# Patient Record
Sex: Female | Born: 1995
Health system: Southern US, Community
[De-identification: ages and names within clinical notes are randomized; demographics above are authoritative.]

## PROBLEM LIST (undated history)

## (undated) ENCOUNTER — Inpatient Hospital Stay (HOSPITAL_COMMUNITY): Payer: Self-pay

## (undated) DIAGNOSIS — R51 Headache: Secondary | ICD-10-CM

## (undated) DIAGNOSIS — J45909 Unspecified asthma, uncomplicated: Secondary | ICD-10-CM

## (undated) DIAGNOSIS — T7840XA Allergy, unspecified, initial encounter: Secondary | ICD-10-CM

## (undated) DIAGNOSIS — R519 Headache, unspecified: Secondary | ICD-10-CM

## (undated) DIAGNOSIS — F419 Anxiety disorder, unspecified: Secondary | ICD-10-CM

## (undated) HISTORY — PX: WISDOM TOOTH EXTRACTION: SHX21

## (undated) HISTORY — PX: NO PAST SURGERIES: SHX2092

---
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
## 2014-03-11 ENCOUNTER — Encounter (HOSPITAL_COMMUNITY): Payer: Self-pay | Admitting: *Deleted

## 2015-05-11 LAB — OB RESULTS CONSOLE GC/CHLAMYDIA: Chlamydia: NEGATIVE

## 2015-05-11 NOTE — L&D Delivery Note (Signed)
Patient was C/C/+2 and pushed for approx 10 minutes with epidural.   NSVD  female infant, Apgars 8/9, weight pending.   The patient had no laceration. Fundus was firm. EBL was expected amount. Placenta was delivered intact. Vagina was clear.  Baby was vigorous and doing skin to skin with mother.  Kristin Walton, Kristin Walton

## 2015-05-21 LAB — OB RESULTS CONSOLE ABO/RH: RH TYPE: POSITIVE

## 2015-05-21 LAB — OB RESULTS CONSOLE GC/CHLAMYDIA: Gonorrhea: NEGATIVE

## 2015-05-21 LAB — OB RESULTS CONSOLE ANTIBODY SCREEN: Antibody Screen: NEGATIVE

## 2015-05-21 LAB — OB RESULTS CONSOLE HEPATITIS B SURFACE ANTIGEN: HEP B S AG: NEGATIVE

## 2015-05-21 LAB — OB RESULTS CONSOLE RUBELLA ANTIBODY, IGM: Rubella: IMMUNE

## 2015-05-21 LAB — OB RESULTS CONSOLE RPR: RPR: NONREACTIVE

## 2015-05-21 LAB — OB RESULTS CONSOLE HIV ANTIBODY (ROUTINE TESTING): HIV: NONREACTIVE

## 2015-10-23 LAB — OB RESULTS CONSOLE GBS: STREP GROUP B AG: NEGATIVE

## 2015-11-05 ENCOUNTER — Ambulatory Visit (INDEPENDENT_AMBULATORY_CARE_PROVIDER_SITE_OTHER): Payer: 59 | Admitting: Neurology

## 2015-11-05 ENCOUNTER — Encounter: Payer: Self-pay | Admitting: Neurology

## 2015-11-05 VITALS — HR 98 | Ht 66.0 in | Wt 152.0 lb

## 2015-11-05 DIAGNOSIS — Z3493 Encounter for supervision of normal pregnancy, unspecified, third trimester: Secondary | ICD-10-CM

## 2015-11-05 DIAGNOSIS — H539 Unspecified visual disturbance: Secondary | ICD-10-CM | POA: Diagnosis not present

## 2015-11-05 NOTE — Progress Notes (Signed)
NEUROLOGY CONSULTATION NOTE  Antoine Pocheshley C Gist MRN: [id number] DOB: 1995/11/29  Referring provider: Dr. Claiborne Billingsallahan Primary care provider: No PCP  Reason for consult:  Visual disturbance  HISTORY OF PRESENT ILLNESS: Kristin Walton is a 20 year old right-handed female at 3538 weeks 2 days gestation and history of migraines who presents for visual disturbance.  History obtained by patient, her husband and OB/GYN note.  She has history of migraines with visual aura.  Headaches are bi-temporal and associated with nausea.  They are preceded by a central scintillating scotoma, which usually lasts 30 minutes.  They usually occur once a month.  Since she has been pregnant, she has had a gradual increase in frequency, to about once a week.  On Monday, she developed the central scintillating scotoma, however it has still not resolved.  She did initially have a slight headache with mild nausea, but that didn't last more than a day.  She currently denies headache, diplopia, vertigo or focal weakness.  She occasionally has numbness in the leg that is noticeable when standing or walking but not sitting.  She has low back pain but no radicular pain.  She denies weakness.  She is able to shake the feeling out.  Her baby is leaning on the right side.    She was seen by ophthalmology, Dr. Delaney MeigsStonecipher, today.  Her exam was unremarkable.  She did not demonstrate visual field loss, papilledema/optic neuropathy, or abnormality of her cornea or lens.  08/20/15 CBC showed WBC 5.2, HGB 11.9, HCT 35.5 and PLT 149.  PAST MEDICAL HISTORY: Past Medical History  Diagnosis Date  . Asthma   . Allergy     PAST SURGICAL HISTORY: Past Surgical History  Procedure Laterality Date  . No past surgeries    . Wisdom tooth extraction      MEDICATIONS: Current Outpatient Prescriptions on File Prior to Visit  Medication Sig Dispense Refill  . Prenatal Vit-Fe Fumarate-FA (PRENATAL MULTIVITAMIN) TABS Take 1 tablet by mouth every  morning.     No current facility-administered medications on file prior to visit.    ALLERGIES: Allergies  Allergen Reactions  . Amoxicillin Hives  . Penicillins Hives  . Zithromax [Azithromycin]     FAMILY HISTORY: Family History  Problem Relation Age of Onset  . Hypertension Father   Brother has migraines  SOCIAL HISTORY: Social History   Social History  . Marital Status: Married    Spouse Name: N/A  . Number of Children: N/A  . Years of Education: N/A   Occupational History  . Not on file.   Social History Main Topics  . Smoking status: Never Smoker   . Smokeless tobacco: Not on file  . Alcohol Use: No  . Drug Use: No  . Sexual Activity: Yes   Other Topics Concern  . Not on file   Social History Narrative    REVIEW OF SYSTEMS: Constitutional: No fevers, chills, or sweats, no generalized fatigue, change in appetite Eyes: No visual changes, double vision, eye pain Ear, nose and throat: No hearing loss, ear pain, nasal congestion, sore throat Cardiovascular: No chest pain, palpitations Respiratory:  No shortness of breath at rest or with exertion, wheezes GastrointestinaI: No nausea, vomiting, diarrhea, abdominal pain, fecal incontinence Genitourinary:  No dysuria, urinary retention or frequency Musculoskeletal:  No neck pain, back pain Integumentary: No rash, pruritus, skin lesions Neurological: as above Psychiatric: No depression, insomnia, anxiety Endocrine: No palpitations, fatigue, diaphoresis, mood swings, change in appetite, change in weight, increased thirst Hematologic/Lymphatic:  No purpura, petechiae. Allergic/Immunologic: no itchy/runny eyes, nasal congestion, recent allergic reactions, rashes  PHYSICAL EXAM: Filed Vitals:   11/05/15 1239  Pulse: 98   General: No acute distress.  Patient appears well-groomed.  Head:  Normocephalic/atraumatic Eyes:  fundi examined but not visualized Neck: supple, no paraspinal tenderness, full range of  motion Back: No paraspinal tenderness Heart: regular rate and rhythm Lungs: Clear to auscultation bilaterally. Vascular: No carotid bruits. Neurological Exam: Mental status: alert and oriented to person, place, and time, recent and remote memory intact, fund of knowledge intact, attention and concentration intact, speech fluent and not dysarthric, language intact. Cranial nerves: CN I: not tested CN II: pupils equal, round and reactive to light, visual fields intact CN III, IV, VI:  full range of motion, no nystagmus, no ptosis CN V: facial sensation intact CN VII: upper and lower face symmetric CN VIII: hearing intact CN IX, X: gag intact, uvula midline CN XI: sternocleidomastoid and trapezius muscles intact CN XII: tongue midline Bulk & Tone: normal, no fasciculations. Motor:  5/5 throughout  Sensation:  Pinprick and vibration sensation intact. Deep Tendon Reflexes:  2+ throughout, toes downgoing.  Finger to nose testing:  Without dysmetria.  Heel to shin:  Without dysmetria.  Gait:  Normal station and stride.  Able to turn and tandem walk. Romberg negative.  IMPRESSION: Persistent migraine aura.  Physical exam is normal. Pregnancy  PLAN: 1.  Given that she is hypercoagulable, will get MRI of brain to rule out infarction 2.  At this point, I wouldn't prescribe any medications to abort the aura as she is pregnant.  I would have her deliver her baby and see her afterwards.  Thank you for allowing me to take part in the care of this patient.  Shon MilletAdam Treasure Ochs, DO  CC:  Philip AspenSidney Callahan, DO

## 2015-11-05 NOTE — Patient Instructions (Signed)
You appear to have a persistent migraine aura (visual symptom).  We will need to get an MRI of the brain to see if anything else is going on.  If unremarkable, I would not do anything until after you have your baby.  Will let you know of results and any further recommendations.  Otherwise, follow up after you deliver.

## 2015-11-05 NOTE — Progress Notes (Signed)
Chart forwarded.  

## 2015-11-05 NOTE — Progress Notes (Signed)
Pt would like to discuss MRI with OB/GYN first, before scheduling. Order was placed in Epic, and pt was given [facility] ([address] [place], [place] [address] ph: [phone] fax: [phone]) information to contact to schedule if she and OB decides they would like to proceed.

## 2015-11-07 ENCOUNTER — Other Ambulatory Visit: Payer: Self-pay | Admitting: Obstetrics and Gynecology

## 2015-11-10 ENCOUNTER — Inpatient Hospital Stay (HOSPITAL_COMMUNITY): Payer: 59 | Admitting: Anesthesiology

## 2015-11-10 ENCOUNTER — Encounter (HOSPITAL_COMMUNITY): Payer: Self-pay

## 2015-11-10 ENCOUNTER — Inpatient Hospital Stay (HOSPITAL_COMMUNITY)
Admission: RE | Admit: 2015-11-10 | Discharge: 2015-11-11 | DRG: 775 | Disposition: A | Discharge status: Home / Self Care | Payer: 59 | Source: Ambulatory Visit | Attending: Obstetrics and Gynecology | Admitting: Obstetrics and Gynecology

## 2015-11-10 DIAGNOSIS — Z3490 Encounter for supervision of normal pregnancy, unspecified, unspecified trimester: Secondary | ICD-10-CM

## 2015-11-10 DIAGNOSIS — Z3A39 39 weeks gestation of pregnancy: Secondary | ICD-10-CM | POA: Diagnosis not present

## 2015-11-10 HISTORY — DX: Headache: R51

## 2015-11-10 HISTORY — DX: Headache, unspecified: R51.9

## 2015-11-10 LAB — CBC
HCT: 33.3 % — ABNORMAL LOW (ref 36.0–46.0)
HEMOGLOBIN: 11.2 g/dL — AB (ref 12.0–15.0)
MCH: 25.9 pg — ABNORMAL LOW (ref 26.0–34.0)
MCHC: 33.6 g/dL (ref 30.0–36.0)
MCV: 77.1 fL — ABNORMAL LOW (ref 78.0–100.0)
Platelets: 153 10*3/uL (ref 150–400)
RBC: 4.32 MIL/uL (ref 3.87–5.11)
RDW: 14.9 % (ref 11.5–15.5)
WBC: 6.4 10*3/uL (ref 4.0–10.5)

## 2015-11-10 LAB — RPR: RPR: NONREACTIVE

## 2015-11-10 LAB — TYPE AND SCREEN
ABO/RH(D): A POS
ANTIBODY SCREEN: NEGATIVE

## 2015-11-10 LAB — ABO/RH: ABO/RH(D): A POS

## 2015-11-10 MED ORDER — OXYTOCIN 40 UNITS IN LACTATED RINGERS INFUSION - SIMPLE MED: 2.5000 [IU]/h | INTRAVENOUS | Status: DC

## 2015-11-10 MED ORDER — WITCH HAZEL-GLYCERIN EX PADS: 1.0000 "application " | MEDICATED_PAD | CUTANEOUS | Status: DC | PRN

## 2015-11-10 MED ORDER — PRENATAL MULTIVITAMIN CH
1.0000 | ORAL_TABLET | Freq: Every day | ORAL | Status: DC
  Administered 2015-11-10 – 2015-11-11 (×2): 1 via ORAL
  Filled 2015-11-10 (×2): qty 1

## 2015-11-10 MED ORDER — TETANUS-DIPHTH-ACELL PERTUSSIS 5-2.5-18.5 LF-MCG/0.5 IM SUSP
0.5000 mL | Freq: Once | INTRAMUSCULAR | Status: DC
Start: 2015-11-11 — End: 2015-11-11

## 2015-11-10 MED ORDER — LACTATED RINGERS IV SOLN
500.0000 mL | INTRAVENOUS | Status: DC | PRN
  Administered 2015-11-10: 500 mL via INTRAVENOUS

## 2015-11-10 MED ORDER — PHENYLEPHRINE 40 MCG/ML (10ML) SYRINGE FOR IV PUSH (FOR BLOOD PRESSURE SUPPORT)
80.0000 ug | PREFILLED_SYRINGE | INTRAVENOUS | Status: AC | PRN
  Administered 2015-11-10 (×3): 80 ug via INTRAVENOUS

## 2015-11-10 MED ORDER — FENTANYL 2.5 MCG/ML BUPIVACAINE 1/10 % EPIDURAL INFUSION (WH - ANES)
INTRAMUSCULAR | Status: AC
  Filled 2015-11-10: qty 125

## 2015-11-10 MED ORDER — EPHEDRINE 5 MG/ML INJ
10.0000 mg | INTRAVENOUS | Status: DC | PRN
Start: 2015-11-10 — End: 2015-11-10
  Filled 2015-11-10: qty 4
  Filled 2015-11-10: qty 2

## 2015-11-10 MED ORDER — ONDANSETRON HCL 4 MG/2ML IJ SOLN: 4.0000 mg | Freq: Four times a day (QID) | INTRAMUSCULAR | Status: DC | PRN

## 2015-11-10 MED ORDER — SOD CITRATE-CITRIC ACID 500-334 MG/5ML PO SOLN: 30.0000 mL | ORAL | Status: DC | PRN

## 2015-11-10 MED ORDER — LIDOCAINE HCL (PF) 1 % IJ SOLN
INTRAMUSCULAR | Status: DC | PRN
  Administered 2015-11-10 (×2): 6 mL

## 2015-11-10 MED ORDER — ZOLPIDEM TARTRATE 5 MG PO TABS: 5.0000 mg | ORAL_TABLET | Freq: Every evening | ORAL | Status: DC | PRN

## 2015-11-10 MED ORDER — FLEET ENEMA 7-19 GM/118ML RE ENEM: 1.0000 | ENEMA | RECTAL | Status: DC | PRN

## 2015-11-10 MED ORDER — LACTATED RINGERS IV SOLN
500.0000 mL | Freq: Once | INTRAVENOUS | Status: AC
  Administered 2015-11-10: 500 mL via INTRAVENOUS

## 2015-11-10 MED ORDER — DIBUCAINE 1 % RE OINT: 1.0000 "application " | TOPICAL_OINTMENT | RECTAL | Status: DC | PRN

## 2015-11-10 MED ORDER — EPHEDRINE 5 MG/ML INJ
10.0000 mg | INTRAVENOUS | Status: DC | PRN
Start: 2015-11-10 — End: 2015-11-10
  Administered 2015-11-10: 10 mg via INTRAVENOUS
  Filled 2015-11-10: qty 2

## 2015-11-10 MED ORDER — ONDANSETRON HCL 4 MG PO TABS: 4.0000 mg | ORAL_TABLET | ORAL | Status: DC | PRN

## 2015-11-10 MED ORDER — LACTATED RINGERS IV SOLN: 500.0000 mL | Freq: Once | INTRAVENOUS | Status: DC

## 2015-11-10 MED ORDER — FENTANYL 2.5 MCG/ML BUPIVACAINE 1/10 % EPIDURAL INFUSION (WH - ANES)
14.0000 mL/h | INTRAMUSCULAR | Status: DC | PRN
  Administered 2015-11-10 (×3): 14 mL/h via EPIDURAL
  Filled 2015-11-10: qty 125

## 2015-11-10 MED ORDER — LIDOCAINE HCL (PF) 1 % IJ SOLN
30.0000 mL | INTRAMUSCULAR | Status: DC | PRN
  Filled 2015-11-10: qty 30

## 2015-11-10 MED ORDER — PHENYLEPHRINE 40 MCG/ML (10ML) SYRINGE FOR IV PUSH (FOR BLOOD PRESSURE SUPPORT)
PREFILLED_SYRINGE | INTRAVENOUS | Status: AC
  Filled 2015-11-10: qty 20

## 2015-11-10 MED ORDER — OXYCODONE-ACETAMINOPHEN 5-325 MG PO TABS: 1.0000 | ORAL_TABLET | ORAL | Status: DC | PRN

## 2015-11-10 MED ORDER — OXYTOCIN 40 UNITS IN LACTATED RINGERS INFUSION - SIMPLE MED
1.0000 m[IU]/min | INTRAVENOUS | Status: DC
  Administered 2015-11-10: 2 m[IU]/min via INTRAVENOUS
  Filled 2015-11-10: qty 1000

## 2015-11-10 MED ORDER — SIMETHICONE 80 MG PO CHEW: 80.0000 mg | CHEWABLE_TABLET | ORAL | Status: DC | PRN

## 2015-11-10 MED ORDER — BENZOCAINE-MENTHOL 20-0.5 % EX AERO: 1.0000 "application " | INHALATION_SPRAY | CUTANEOUS | Status: DC | PRN

## 2015-11-10 MED ORDER — IBUPROFEN 600 MG PO TABS
600.0000 mg | ORAL_TABLET | Freq: Four times a day (QID) | ORAL | Status: DC
  Administered 2015-11-10 – 2015-11-11 (×6): 600 mg via ORAL
  Filled 2015-11-10 (×6): qty 1

## 2015-11-10 MED ORDER — TERBUTALINE SULFATE 1 MG/ML IJ SOLN
0.2500 mg | Freq: Once | INTRAMUSCULAR | Status: DC | PRN
  Filled 2015-11-10: qty 1

## 2015-11-10 MED ORDER — LACTATED RINGERS IV SOLN
INTRAVENOUS | Status: DC
  Administered 2015-11-10 (×2): via INTRAVENOUS

## 2015-11-10 MED ORDER — PHENYLEPHRINE 40 MCG/ML (10ML) SYRINGE FOR IV PUSH (FOR BLOOD PRESSURE SUPPORT)
80.0000 ug | PREFILLED_SYRINGE | INTRAVENOUS | Status: DC | PRN
  Filled 2015-11-10: qty 5

## 2015-11-10 MED ORDER — ACETAMINOPHEN 325 MG PO TABS: 650.0000 mg | ORAL_TABLET | ORAL | Status: DC | PRN

## 2015-11-10 MED ORDER — PHENYLEPHRINE 40 MCG/ML (10ML) SYRINGE FOR IV PUSH (FOR BLOOD PRESSURE SUPPORT)
80.0000 ug | PREFILLED_SYRINGE | INTRAVENOUS | Status: DC | PRN
  Filled 2015-11-10: qty 10
  Filled 2015-11-10: qty 5

## 2015-11-10 MED ORDER — COCONUT OIL OIL: 1.0000 "application " | TOPICAL_OIL | Status: DC | PRN

## 2015-11-10 MED ORDER — DIPHENHYDRAMINE HCL 25 MG PO CAPS: 25.0000 mg | ORAL_CAPSULE | Freq: Four times a day (QID) | ORAL | Status: DC | PRN

## 2015-11-10 MED ORDER — DIPHENHYDRAMINE HCL 50 MG/ML IJ SOLN: 12.5000 mg | INTRAMUSCULAR | Status: DC | PRN

## 2015-11-10 MED ORDER — OXYCODONE-ACETAMINOPHEN 5-325 MG PO TABS: 2.0000 | ORAL_TABLET | ORAL | Status: DC | PRN

## 2015-11-10 MED ORDER — ONDANSETRON HCL 4 MG/2ML IJ SOLN: 4.0000 mg | INTRAMUSCULAR | Status: DC | PRN

## 2015-11-10 MED ORDER — SENNOSIDES-DOCUSATE SODIUM 8.6-50 MG PO TABS
2.0000 | ORAL_TABLET | ORAL | Status: DC
  Administered 2015-11-10: 2 via ORAL
  Filled 2015-11-10: qty 2

## 2015-11-10 MED ORDER — OXYTOCIN BOLUS FROM INFUSION: 500.0000 mL | INTRAVENOUS | Status: DC

## 2015-11-10 NOTE — Anesthesia Procedure Notes (Signed)
Epidural Patient location during procedure: OB  Staffing Anesthesiologist: Sherrian DiversENENNY, [place] Pizzimenti  Preanesthetic Checklist Completed: patient identified, site marked, surgical consent, pre-op evaluation, timeout performed, IV checked, risks and benefits discussed and monitors and equipment checked  Epidural Patient position: sitting Prep: DuraPrep Patient monitoring: blood pressure and heart rate Approach: midline Location: L4-L5 Injection technique: LOR saline  Needle:  Needle type: Tuohy  Needle gauge: 17 G Needle length: 9 cm Needle insertion depth: 4.5 cm Catheter type: closed end flexible Catheter size: 19 Gauge Catheter at skin depth: 12 cm Test dose: negative and Other  Assessment Events: blood not aspirated, injection not painful, no injection resistance, negative IV test and no paresthesia  Additional Notes Reason for block:procedure for pain

## 2015-11-10 NOTE — Anesthesia Preprocedure Evaluation (Addendum)
Anesthesia Evaluation  Patient identified by MRN, date of birth, ID band Patient awake    Reviewed: Allergy & Precautions, NPO status , Patient's Chart, lab work & pertinent test results  Airway Mallampati: II  TM Distance: >3 FB Neck ROM: Full    Dental no notable dental hx.    Pulmonary asthma ,    Pulmonary exam normal breath sounds clear to auscultation       Cardiovascular negative cardio ROS Normal cardiovascular exam Rhythm:Regular Rate:Normal     Neuro/Psych H/O migraines. Right eye symptoms (scintillating scotoma). Back pain. Occasional numbness right upper thigh.  Neurology visit of 11-05-15 reviewed (Impression: persistent migraine aura)  Ophthalmology exam by Dr. Delaney MeigsStonecipher unremarkable.  negative psych ROS   GI/Hepatic negative GI ROS, Neg liver ROS,   Endo/Other  negative endocrine ROS  Renal/GU negative Renal ROS  negative genitourinary   Musculoskeletal negative musculoskeletal ROS (+)   Abdominal   Peds negative pediatric ROS (+)  Hematology negative hematology ROS (+)   Anesthesia Other Findings   Reproductive/Obstetrics negative OB ROS                          Anesthesia Physical Anesthesia Plan  ASA: II  Anesthesia Plan: Epidural   Post-op Pain Management:    Induction: Intravenous  Airway Management Planned: Natural Airway  Additional Equipment:   Intra-op Plan:   Post-operative Plan:   Informed Consent: I have reviewed the patients History and Physical, chart, labs and discussed the procedure including the risks, benefits and alternatives for the proposed anesthesia with the patient or authorized representative who has indicated his/her understanding and acceptance.   Dental advisory given  Plan Discussed with: CRNA  Anesthesia Plan Comments: (Informed consent obtained prior to proceeding including risk of failure, 1% risk of PDPH, risk of minor  discomfort and bruising.  Discussed rare but serious complications including epidural abscess, permanent nerve injury, epidural hematoma.  Discussed alternatives to epidural analgesia and patient desires to proceed.  Timeout performed pre-procedure verifying patient name, procedure, and platelet count.  Patient tolerated procedure well.  Longer discussion of risks of epidural in setting of right eye symptoms and right upper thigh numbness. Dr. Moises BloodJaffe's impression is persistent migraine aura although being worked up by neurology and with plan for head MRI after delivery. Discussed that there is no obvious contraindication to epidural but she may be at increased risk of complication in setting of neurological symptoms. She states understanding and wishes to proceed. )      Anesthesia Quick Evaluation

## 2015-11-10 NOTE — H&P (Addendum)
20 y.o. 2268w0d  G2P1001 comes in for scheduled elective IOL.   Otherwise has good fetal movement and no bleeding.  Past Medical History  Diagnosis Date  . Asthma   . Allergy     Past Surgical History  Procedure Laterality Date  . No past surgeries    . Wisdom tooth extraction      OB History  Gravida Para Term Preterm AB SAB TAB Ectopic Multiple Living  2 1 1  0 0 0 0 0 0 1    # Outcome Date GA Lbr Len/2nd Weight Sex Delivery Anes PTL Lv  2 Current           1 Term 02/24/12 2930w5d / 00:39 3.685 kg (8 lb 2 oz) M Vag-Spont EPI  Y      Social History   Social History  . Marital Status: Married    Spouse Name: N/A  . Number of Children: N/A  . Years of Education: N/A   Occupational History  . Not on file.   Social History Main Topics  . Smoking status: Never Smoker   . Smokeless tobacco: Not on file  . Alcohol Use: No  . Drug Use: No  . Sexual Activity: Yes   Other Topics Concern  . Not on file   Social History Narrative   Zithromax; Amoxicillin; Penicillins; and Ciprofloxacin    Prenatal Transfer Tool  Maternal Diabetes: No Genetic Screening: Declined Maternal Ultrasounds/Referrals: Normal Fetal Ultrasounds or other Referrals:  None Maternal Substance Abuse:  No Significant Maternal Medications:  None Significant Maternal Lab Results: Lab values include: Group B Strep negative  Other PNC: migraine with aura increased with frequency during pregnancy, seen by neurology 6/28 for vision loss.  Ophthalmology assessment revealed no visual deficit.  Neuro recommended f/u after delivery for MRI, no other interventions.    Filed Vitals:   11/10/15 0830 11/10/15 0900  BP: 139/76 128/67  Pulse: 96 87  Temp:    Resp:       Lungs/Cor:  NAD Abdomen:  soft, gravid Ex:  no cords, erythema SVE:  3/50/-2 FHTs:  140, good STV, NST R Toco:  q2-3 on pit   A/P   Admitted for elective IOL  Pt discussed neurology eval with Dr. Henderson CloudHorvath and given that MRI was to be delayed  until after delivery, pt opted after counseling with Dr. Henderson CloudHorvath to proceed with elective IOL given favorable cervix and multiparous status.  Pitocin started overnight, no change, will AROm when head applied  GBS Neg  Continue current mgmt   Jeff Mccallum

## 2015-11-10 NOTE — Anesthesia Pain Management Evaluation Note (Signed)
  CRNA Pain Management Visit Note  Patient: Kristin Walton, 20 y.o., female  "Hello I am a member of the anesthesia team at [facility]. We have an anesthesia team available at all times to provide care throughout the hospital, including epidural management and anesthesia for C-section. I don't know your plan for the delivery whether it a natural birth, water birth, IV sedation, nitrous supplementation, doula or epidural, but we want to meet your pain goals."   1.Was your pain managed to your expectations on prior hospitalizations?   Yes   2.What is your expectation for pain management during this hospitalization?     Epidural  3.How can we help you reach that goal? Epidural   Record the patient's initial score and the patient's pain goal.   Pain: 1  Pain Goal: 4 [facility] wants you to be able to say your pain was always managed very well.  [name] 11/10/2015

## 2015-11-10 NOTE — Anesthesia Postprocedure Evaluation (Signed)
Anesthesia Post Note  Patient: Kristin Walton  Procedure(s) Performed: * No procedures listed *  Patient location during evaluation: Mother Baby Anesthesia Type: Epidural Level of consciousness: awake Pain management: satisfactory to patient Vital Signs Assessment: post-procedure vital signs reviewed and stable Respiratory status: spontaneous breathing Cardiovascular status: stable Anesthetic complications: no     Last Vitals:  Filed Vitals:   11/10/15 1421 11/10/15 1530  BP: 114/70 117/64  Pulse: 93 90  Temp: 36.6 C 36.7 C  Resp: 18 18    Last Pain:  Filed Vitals:   11/10/15 1532  PainSc: 0-No pain   Pain Goal:                 KeyCorpBURGER,Mahad Newstrom

## 2015-11-10 NOTE — Progress Notes (Signed)
Pt comfortable without complaints, with epidural FHT 140 R TOCO q2-4 SVE 3.5/70/-1 AROM clear with some blood tinge initially.

## 2015-11-10 NOTE — Progress Notes (Signed)
Spoke with Dr. Claiborne Billingsallahan pt c/o worsening blurring of vision over last 40min. VS all WNL. 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 Pt then stated vision just "squiggly". Pt also has hx of migraines but has no c/o of HA now. Dr. Junie Bameallhan states pt has seen neuro for this complaint they will do MRI after delivery in the office. No treatment needed at this [facility]

## 2015-11-11 LAB — CBC
HCT: 31.9 % — ABNORMAL LOW (ref 36.0–46.0)
Hemoglobin: 10.5 g/dL — ABNORMAL LOW (ref 12.0–15.0)
MCH: 25.3 pg — AB (ref 26.0–34.0)
MCHC: 32.9 g/dL (ref 30.0–36.0)
MCV: 76.9 fL — AB (ref 78.0–100.0)
PLATELETS: 141 10*3/uL — AB (ref 150–400)
RBC: 4.15 MIL/uL (ref 3.87–5.11)
RDW: 14.7 % (ref 11.5–15.5)
WBC: 5.9 10*3/uL (ref 4.0–10.5)

## 2015-11-11 NOTE — Discharge Summary (Signed)
Obstetric Discharge Summary Reason for Admission: induction of labor Prenatal Procedures: ultrasound Intrapartum Procedures: spontaneous vaginal delivery Postpartum Procedures: none Complications-Operative and Postpartum: none HEMOGLOBIN  Date Value Ref Range Status  11/11/2015 10.5* 12.0 - 15.0 g/dL Final   HCT  Date Value Ref Range Status  11/11/2015 31.9* 36.0 - 46.0 % Final    Physical Exam:  General: alert Lochia: appropriate Uterine Fundus: firm   Discharge Diagnoses: Term Pregnancy-delivered  Discharge Information: Date: 11/11/2015 Activity: pelvic rest Diet: routine Medications: PNV and Ibuprofen Condition: stable Instructions: refer to practice specific booklet Discharge to: home Follow-up Information    Follow up with CALLAHAN, SIDNEY, DO. Schedule an appointment as soon as possible for a visit in 1 month.   Specialty:  Obstetrics and Gynecology   Contact information:   [address] [place] [address] [phone]       Newborn Data: Live born female  Birth Weight: 7 lb 5.6 oz (3335 g) APGAR: 8, 9  Home with mother.  ANDERSON,MARK E 11/11/2015, 10:20 AM

## 2015-11-11 NOTE — Lactation Note (Signed)
This note was copied from a baby's chart. Lactation Consultation Note Mother stopped bf her 1st baby after 2 weeks because she did not feel comfortable with it. She reports that she feels much more comfortable BF this baby.  He has eaten several times in the past 24 hours but at this consult he is sleepy. Mother reports that he latches to the left breast well but that the right side is more difficult. Discussed support, positioning and off-center latch.  Baby was too sleepy and not interested in eating. Asked mom to call for latch check every 8 hours.  Informed of support groups and outpatient services.  He is staying until tomorrow related to elevated temp.  Follow-up tomorrow.  Patient Name: Kristin Walton ZOXWR'UToday's Date: 11/11/2015     Maternal Data    Feeding Feeding Type: Breast Fed Length of feed: 30 min  LATCH Score/Interventions Latch: Too sleepy or reluctant, no latch achieved, no sucking elicited.  Audible Swallowing: None  Type of Nipple: Everted at rest and after stimulation  Comfort (Breast/Nipple): Soft / non-tender     Hold (Positioning): Assistance needed to correctly position infant at breast and maintain latch.  LATCH Score: 5  Lactation Tools Discussed/Used     Consult Status Consult Status: Follow-up Date: 11/12/15 Follow-up type: In-patient    Soyla DryerJoseph, Elanna Bert 11/11/2015, 12:04 PM

## 2015-11-11 NOTE — Progress Notes (Signed)
PPD#1 Pt without complaints. Ready for discharge VSSAF IMP/Stable Plan/discharge

## 2015-11-12 ENCOUNTER — Ambulatory Visit: Payer: Self-pay

## 2015-11-12 NOTE — Lactation Note (Signed)
This note was copied from a baby'Walton chart. Lactation Consultation Note  Mom states newborn is breastfeeding much better than her first baby.  Discussed milk coming to volume, engorgement and keeping a feeding diary the first week.  Mom denies questions or concerns at present time.  Outpatient lactation services and support reviewed and encouraged.  Patient Name: Kristin Lisette Abushley Okuda EGBTD'VToday'Walton Date: 11/12/2015     Maternal Data    Feeding    LATCH Score/Interventions                      Lactation Tools Discussed/Used     Consult Status      [name], Kristin Walton 11/12/2015, 10:26 AM

## 2015-11-17 ENCOUNTER — Inpatient Hospital Stay (HOSPITAL_COMMUNITY): Admission: AD | Admit: 2015-11-17 | Payer: Self-pay | Source: Ambulatory Visit | Admitting: Obstetrics and Gynecology

## 2015-12-14 ENCOUNTER — Encounter (HOSPITAL_BASED_OUTPATIENT_CLINIC_OR_DEPARTMENT_OTHER): Payer: Self-pay | Admitting: Emergency Medicine

## 2015-12-14 ENCOUNTER — Emergency Department (HOSPITAL_BASED_OUTPATIENT_CLINIC_OR_DEPARTMENT_OTHER)
Admission: EM | Admit: 2015-12-14 | Discharge: 2015-12-15 | Disposition: A | Discharge status: Home / Self Care | Payer: 59 | Attending: Emergency Medicine | Admitting: Emergency Medicine

## 2015-12-14 ENCOUNTER — Emergency Department (HOSPITAL_BASED_OUTPATIENT_CLINIC_OR_DEPARTMENT_OTHER): Payer: 59

## 2015-12-14 DIAGNOSIS — J45909 Unspecified asthma, uncomplicated: Secondary | ICD-10-CM | POA: Insufficient documentation

## 2015-12-14 DIAGNOSIS — H538 Other visual disturbances: Secondary | ICD-10-CM | POA: Insufficient documentation

## 2015-12-14 DIAGNOSIS — M25521 Pain in right elbow: Secondary | ICD-10-CM | POA: Diagnosis not present

## 2015-12-14 MED ORDER — NAPROXEN 250 MG PO TABS
500.0000 mg | ORAL_TABLET | Freq: Once | ORAL | Status: AC
  Administered 2015-12-14: 500 mg via ORAL
  Filled 2015-12-14: qty 2

## 2015-12-14 NOTE — ED Triage Notes (Signed)
Patient states at 1900 she was sitting on the couch holding her baby and her arm went numb and she began to have loss of vision in her left eye. Patient with strong & equal bilateral grips, no deficits noted.

## 2015-12-14 NOTE — ED Provider Notes (Signed)
[facility] Provider Note   CSN: [id number] Arrival date & time: 12/14/15  2023  First Provider Contact:   First MD Initiated Contact with Patient 12/14/15 2317    By signing my name below, I, Levon HedgerElizabeth Hall, attest that this documentation has been prepared under the direction and in the presence of Breann Losano, MD . Electronically Signed: Levon HedgerElizabeth Hall, Scribe. 12/14/2015. 11:27 PM.  History   Chief Complaint Chief Complaint  Patient presents with  . Arm Pain    right, shooting pains and numbness  . vision changes    right eye    HPI Kristin Walton is a 20 y.o. female who presents to the Emergency Department complaining of elbow pain with radiation to forearm and wrist onset tonight 7 pm. She also notes associated tingling, particularly when holding her children. No alleviating or modifying factors noted.  No rashes.  No redness.  No trauma.  Pt also complains of worsening right sided vision changes that have been ongoing for some time.  She has a black circular spot in her right eye. She has seen a neurologist for this and is to have an MRI. No changes in speech or cognition.  No back or neck pain.  No HA.  No f/c/r.  No facial droop no weakness.  Has been seen by an eye doctor as well who told her everything was fine.    The history is provided by the patient. No language interpreter was used.    Past Medical History:  Diagnosis Date  . Allergy   . Asthma   . Headache    with aura, saw nero on 11/05/15    Patient Active Problem List   Diagnosis Date Noted  . Pregnancy 11/10/2015    Past Surgical History:  Procedure Laterality Date  . NO PAST SURGERIES    . WISDOM TOOTH EXTRACTION      OB History    Gravida Para Term Preterm AB Living   2 2 2  0 0 2   SAB TAB Ectopic Multiple Live Births   0 0 0 0 2       Home Medications    Prior to Admission medications   Medication Sig Start Date End Date Taking? Authorizing Provider  Prenatal Vit-Fe Fumarate-FA  (PRENATAL MULTIVITAMIN) TABS Take 1 tablet by mouth 2 (two) times daily.     Historical Provider, MD    Family History Family History  Problem Relation Age of Onset  . Hypertension Father     Social History Social History  Substance Use Topics  . Smoking status: Never Smoker  . Smokeless tobacco: Never Used  . Alcohol use No     Allergies   Zithromax [azithromycin]; Amoxicillin; Penicillins; and Ciprofloxacin   Review of Systems Review of Systems  Constitutional: Negative for fever.  HENT: Negative for drooling and voice change.   Eyes: Positive for visual disturbance. Negative for pain, discharge, redness and itching.  Respiratory: Negative for shortness of breath.   Cardiovascular: Negative for chest pain, palpitations and leg swelling.  Gastrointestinal: Negative for abdominal pain.  Genitourinary: Negative for difficulty urinating.  Musculoskeletal: Positive for arthralgias and myalgias. Negative for back pain, neck pain and neck stiffness.  Neurological: Negative for dizziness, tremors, seizures, syncope, facial asymmetry, speech difficulty, weakness, light-headedness and headaches.  All other systems reviewed and are negative.   Physical Exam Updated Vital Signs BP 121/76 (BP Location: Left Arm)   Pulse 87   Temp 97.7 F (36.5 C) (Oral)  Resp 18   Ht 5\' 6"  (1.676 m)   Wt 140 lb (63.5 kg)   SpO2 100%   BMI 22.60 kg/m   Physical Exam  Constitutional: She is oriented to person, place, and time. She appears well-developed and well-nourished. No distress.  HENT:  Head: Normocephalic and atraumatic.  Mouth/Throat: Oropharynx is clear and moist. No oropharyngeal exudate.  Moist mucous membranes   Eyes: Conjunctivae are normal. Pupils are equal, round, and reactive to light.  Neck: Normal range of motion. Neck supple. No JVD present.  Trachea midline No bruit  Cardiovascular: Normal rate, regular rhythm and normal heart sounds.   3+ radial pulse No  blanching  Pulmonary/Chest: Effort normal and breath sounds normal. No stridor. No respiratory distress.  Abdominal: Soft. Bowel sounds are normal. She exhibits no distension.  Musculoskeletal: Normal range of motion. She exhibits no edema or tenderness.       Right elbow: Normal.      Right wrist: Normal.       Cervical back: Normal.       Thoracic back: Normal.       Lumbar back: Normal.       Right forearm: Normal.       Right hand: Normal. She exhibits normal range of motion and normal capillary refill. Normal sensation noted. Decreased sensation is not present in the ulnar distribution, is not present in the medial distribution and is not present in the radial distribution.  Negative Neer's test on right side Both tendons of the biceps and triceps intact.  FROM with 5/5 RUE strength. No fasiculations   Neurological: She is alert and oriented to person, place, and time. She has normal reflexes. She displays normal reflexes. She exhibits normal muscle tone.  Great medial nerve sensation Dtr's intact Cranial nerves 2-12 are intact   Skin: Skin is warm and dry. Capillary refill takes less than 2 seconds.  Psychiatric: She has a normal mood and affect. Her behavior is normal.  Nursing note and vitals reviewed.   ED Treatments / Results  DIAGNOSTIC STUDIES:  Oxygen Saturation is 100% on RA, normal by my interpretation.    COORDINATION OF CARE:  11:22 PM Discussed treatment plan with pt at bedside and pt agreed to plan.  Labs (all labs ordered are listed, but only abnormal results are displayed) Labs Reviewed - No data to display  EKG  EKG Interpretation None       Radiology No results found.  Procedures Procedures (including critical care time)  Medications Ordered in ED Medications - No data to display   Initial Impression / Assessment and Plan / ED Course  I have reviewed the triage vital signs and the nursing notes.  Pertinent labs & imaging results that were  available during my care of the patient were reviewed by me and considered in my medical decision making (see chart for details).  Clinical Course   Vitals:   12/14/15 2058 12/15/15 0100  BP: 121/76 108/77  Pulse: 87 72  Resp: 18 18  Temp: 97.7 F (36.5 C)    Results for orders placed or performed during the hospital encounter of 11/10/15  OB RESULT CONSOLE Group B Strep  Result Value Ref Range   GBS Negative   CBC  Result Value Ref Range   WBC 6.4 4.0 - 10.5 K/uL   RBC 4.32 3.87 - 5.11 MIL/uL   Hemoglobin 11.2 (L) 12.0 - 15.0 g/dL   HCT 45.4 (L) 09.8 - 11.9 %   MCV  77.1 (L) 78.0 - 100.0 fL   MCH 25.9 (L) 26.0 - 34.0 pg   MCHC 33.6 30.0 - 36.0 g/dL   RDW 16.1 09.6 - 04.5 %   Platelets 153 150 - 400 K/uL  RPR  Result Value Ref Range   RPR Ser Ql Non Reactive Non Reactive  OB RESULTS CONSOLE GC/Chlamydia  Result Value Ref Range   Chlamydia Negative   OB RESULTS CONSOLE GC/Chlamydia  Result Value Ref Range   Gonorrhea Negative   OB RESULTS CONSOLE RPR  Result Value Ref Range   RPR Nonreactive   OB RESULTS CONSOLE HIV antibody  Result Value Ref Range   HIV Non-reactive   OB RESULTS CONSOLE Rubella Antibody  Result Value Ref Range   Rubella Immune   OB RESULTS CONSOLE Hepatitis B surface antigen  Result Value Ref Range   Hepatitis B Surface Ag Negative   CBC  Result Value Ref Range   WBC 5.9 4.0 - 10.5 K/uL   RBC 4.15 3.87 - 5.11 MIL/uL   Hemoglobin 10.5 (L) 12.0 - 15.0 g/dL   HCT 40.9 (L) 81.1 - 91.4 %   MCV 76.9 (L) 78.0 - 100.0 fL   MCH 25.3 (L) 26.0 - 34.0 pg   MCHC 32.9 30.0 - 36.0 g/dL   RDW 78.2 95.6 - 21.3 %   Platelets 141 (L) 150 - 400 K/uL  Type and screen [facility]  Result Value Ref Range   ABO/RH(D) A POS    Antibody Screen NEG    Sample Expiration 11/13/2015   OB RESULTS CONSOLE ABO/Rh  Result Value Ref Range   RH Type  Positive    ABO Grouping A   OB RESULTS CONSOLE Antibody Screen  Result Value Ref Range   Antibody  Screen Negative   ABO/Rh  Result Value Ref Range   ABO/RH(D) A POS    Dg Elbow Complete Right  Result Date: 12/15/2015 CLINICAL DATA:  Acute onset of right elbow numbness and pain. Initial encounter. EXAM: RIGHT ELBOW - COMPLETE 3+ VIEW COMPARISON:  None. FINDINGS: There is no evidence of fracture or dislocation. The visualized joint spaces are preserved. No significant joint effusion is identified. The soft tissues are unremarkable in appearance. IMPRESSION: No evidence of fracture or dislocation. Electronically Signed   By: Roanna Raider M.D.   On: 12/15/2015 00:24   Medications  naproxen (NAPROSYN) tablet 500 mg (500 mg Oral Given 12/14/15 2332)    No objective sensory loss on exam.  I suspect these a paresthesias from holding the children and having her arm in a certain position.  Have advised ice at the elbow elevation of the arm with palm up and NSAIDS and follow up with your neurologist for ongoing care of ongoing visual issues.   Follow up with [facility].  Information provided as well as tylenol and ibuprofen dosage sheet. All questions answered to patient's moms satisfaction. Based on history and exam patient has been appropriately medically screened and emergency conditions excluded. Patient is stable for discharge at this time. Follow up with your PMDfor recheck in 2 daysand strict return precautions given.  Final Clinical Impressions(s) / ED Diagnoses   Final diagnoses:  None  I personally performed the services described in this documentation, which was scribed in my presence. The recorded information has been reviewed and is accurate.    New Prescriptions New Prescriptions   No medications on file     Carlisle Enke, MD 12/15/15 [phone]

## 2015-12-15 ENCOUNTER — Encounter (HOSPITAL_BASED_OUTPATIENT_CLINIC_OR_DEPARTMENT_OTHER): Payer: Self-pay | Admitting: Emergency Medicine

## 2015-12-15 MED ORDER — NAPROXEN 375 MG PO TABS: 375.0000 mg | ORAL_TABLET | Freq: Two times a day (BID) | ORAL | 0 refills | Status: DC

## 2015-12-15 NOTE — ED Notes (Signed)
Pt to follow up with previously established neurologist. Pt agreeable to follow up.

## 2015-12-17 ENCOUNTER — Ambulatory Visit
Admission: RE | Admit: 2015-12-17 | Discharge: 2015-12-17 | Disposition: A | Discharge status: Home / Self Care | Payer: 59 | Source: Ambulatory Visit | Attending: Neurology | Admitting: Neurology

## 2015-12-17 ENCOUNTER — Telehealth: Payer: Self-pay

## 2015-12-17 DIAGNOSIS — H539 Unspecified visual disturbance: Secondary | ICD-10-CM

## 2015-12-17 NOTE — Telephone Encounter (Signed)
Message relayed to patient. Verbalized understanding and denied questions.   Pt stated that she did not want any medication for aura/migraines. Pt will call to schedule a follow up if headache get worse or she would like medication.

## 2015-12-17 NOTE — Telephone Encounter (Signed)
-----   Message from Drema DallasAdam R Jaffe, DO sent at 12/17/2015  9:50 AM EDT ----- MRI is normal

## 2015-12-21 ENCOUNTER — Other Ambulatory Visit: Payer: 59

## 2016-07-08 ENCOUNTER — Encounter (HOSPITAL_BASED_OUTPATIENT_CLINIC_OR_DEPARTMENT_OTHER): Payer: Self-pay | Admitting: Emergency Medicine

## 2016-07-08 ENCOUNTER — Emergency Department (HOSPITAL_BASED_OUTPATIENT_CLINIC_OR_DEPARTMENT_OTHER)
Admission: EM | Admit: 2016-07-08 | Discharge: 2016-07-08 | Disposition: A | Discharge status: Home / Self Care | Payer: 59 | Attending: Emergency Medicine | Admitting: Emergency Medicine

## 2016-07-08 DIAGNOSIS — Z79899 Other long term (current) drug therapy: Secondary | ICD-10-CM | POA: Insufficient documentation

## 2016-07-08 DIAGNOSIS — F419 Anxiety disorder, unspecified: Secondary | ICD-10-CM | POA: Diagnosis not present

## 2016-07-08 DIAGNOSIS — J45909 Unspecified asthma, uncomplicated: Secondary | ICD-10-CM | POA: Diagnosis not present

## 2016-07-08 DIAGNOSIS — R197 Diarrhea, unspecified: Secondary | ICD-10-CM | POA: Insufficient documentation

## 2016-07-08 DIAGNOSIS — R55 Syncope and collapse: Secondary | ICD-10-CM | POA: Diagnosis not present

## 2016-07-08 HISTORY — DX: Anxiety disorder, unspecified: F41.9

## 2016-07-08 LAB — CBC WITH DIFFERENTIAL/PLATELET
BASOS PCT: 0 %
Basophils Absolute: 0 10*3/uL (ref 0.0–0.1)
EOS ABS: 0 10*3/uL (ref 0.0–0.7)
EOS PCT: 1 %
HCT: 41.5 % (ref 36.0–46.0)
Hemoglobin: 14.2 g/dL (ref 12.0–15.0)
LYMPHS ABS: 1 10*3/uL (ref 0.7–4.0)
Lymphocytes Relative: 21 %
MCH: 25.5 pg — AB (ref 26.0–34.0)
MCHC: 34.2 g/dL (ref 30.0–36.0)
MCV: 74.5 fL — AB (ref 78.0–100.0)
MONO ABS: 0.4 10*3/uL (ref 0.1–1.0)
Monocytes Relative: 8 %
NEUTROS PCT: 70 %
Neutro Abs: 3.3 10*3/uL (ref 1.7–7.7)
PLATELETS: 190 10*3/uL (ref 150–400)
RBC: 5.57 MIL/uL — ABNORMAL HIGH (ref 3.87–5.11)
RDW: 14.3 % (ref 11.5–15.5)
WBC: 4.7 10*3/uL (ref 4.0–10.5)

## 2016-07-08 LAB — COMPREHENSIVE METABOLIC PANEL
ALK PHOS: 60 U/L (ref 38–126)
ALT: 15 U/L (ref 14–54)
AST: 20 U/L (ref 15–41)
Albumin: 4.7 g/dL (ref 3.5–5.0)
Anion gap: 9 (ref 5–15)
BUN: 11 mg/dL (ref 6–20)
CALCIUM: 9.8 mg/dL (ref 8.9–10.3)
CO2: 26 mmol/L (ref 22–32)
CREATININE: 0.87 mg/dL (ref 0.44–1.00)
Chloride: 105 mmol/L (ref 101–111)
Glucose, Bld: 101 mg/dL — ABNORMAL HIGH (ref 65–99)
Potassium: 3.4 mmol/L — ABNORMAL LOW (ref 3.5–5.1)
Sodium: 140 mmol/L (ref 135–145)
Total Bilirubin: 1 mg/dL (ref 0.3–1.2)
Total Protein: 8 g/dL (ref 6.5–8.1)

## 2016-07-08 LAB — HCG, QUANTITATIVE, PREGNANCY

## 2016-07-08 MED ORDER — SODIUM CHLORIDE 0.9 % IV BOLUS (SEPSIS)
1000.0000 mL | Freq: Once | INTRAVENOUS | Status: AC
  Administered 2016-07-08: 1000 mL via INTRAVENOUS

## 2016-07-08 MED ORDER — LORAZEPAM 2 MG/ML IJ SOLN
0.5000 mg | Freq: Once | INTRAMUSCULAR | Status: AC
  Administered 2016-07-08: 0.5 mg via INTRAVENOUS
  Filled 2016-07-08: qty 1

## 2016-07-08 MED ORDER — LORAZEPAM 1 MG PO TABS: 1.0000 mg | ORAL_TABLET | Freq: Three times a day (TID) | ORAL | 0 refills | Status: AC | PRN

## 2016-07-08 MED ORDER — ONDANSETRON HCL 4 MG/2ML IJ SOLN
4.0000 mg | Freq: Once | INTRAMUSCULAR | Status: AC
  Administered 2016-07-08: 4 mg via INTRAVENOUS
  Filled 2016-07-08: qty 2

## 2016-07-08 MED FILL — LORazepam 1 MG TABS: 1 | 2 days supply | Qty: 6 | Fill #0

## 2016-07-08 NOTE — ED Notes (Signed)
ED Provider at bedside. 

## 2016-07-08 NOTE — Discharge Instructions (Signed)
Drink plenty of fluids and plenty of rest.  Ativan as prescribed as needed for anxiety.  Follow-up with your primary Dr. in the next week to discuss your medications.

## 2016-07-08 NOTE — ED Provider Notes (Signed)
[facility]   CSN: [id number] Arrival date & time: 07/08/16  [id number]     History   Chief Complaint Chief Complaint  Patient presents with  . Diarrhea  . Medication Reaction    HPI Antoine Pocheshley C Arciniega is a 21 y.o. female.  Patient is a 21 year old female with no significant past medical history. She presents for evaluation of nausea, decreased by mouth intake, and diarrhea that started 3 days ago. Her symptoms coincided with starting Prozac for anxiety and the patient feels as though her symptoms are caused by this medication. She denies any ill contacts. She denies any bloody stool or significant abdominal pain.   The history is provided by the patient and a parent.  Diarrhea   This is a new problem. Episode onset: 3 days ago. The problem occurs continuously. The problem has been gradually worsening. The stool consistency is described as watery. There has been no fever. She has tried nothing for the symptoms. The treatment provided no relief.    Past Medical History:  Diagnosis Date  . Allergy   . Anxiety   . Asthma   . Headache    with aura, saw nero on 11/05/15    Patient Active Problem List   Diagnosis Date Noted  . Pregnancy 11/10/2015    Past Surgical History:  Procedure Laterality Date  . NO PAST SURGERIES    . WISDOM TOOTH EXTRACTION      OB History    Gravida Para Term Preterm AB Living   2 2 2  0 0 2   SAB TAB Ectopic Multiple Live Births   0 0 0 0 2       Home Medications    Prior to Admission medications   Medication Sig Start Date End Date Taking? Authorizing Provider  FLUoxetine (PROZAC) 20 MG tablet Take 20 mg by mouth daily.   Yes Historical Provider, MD    Family History Family History  Problem Relation Age of Onset  . Hypertension Father     Social History Social History  Substance Use Topics  . Smoking status: Never Smoker  . Smokeless tobacco: Never Used  . Alcohol use No     Allergies   Zithromax  [azithromycin]; Amoxicillin; Penicillins; and Ciprofloxacin   Review of Systems Review of Systems  Gastrointestinal: Positive for diarrhea.  All other systems reviewed and are negative.    Physical Exam Updated Vital Signs BP 112/77   Pulse 104   Temp 97.8 F (36.6 C)   Ht 5\' 6"  (1.676 m)   Wt 120 lb (54.4 kg)   LMP 06/27/2016   SpO2 100%   BMI 19.37 kg/m   Physical Exam  Constitutional: She is oriented to person, place, and time. She appears well-developed and well-nourished. No distress.  She does appear somewhat anxious.  HENT:  Head: Normocephalic and atraumatic.  Neck: Normal range of motion. Neck supple.  Cardiovascular: Normal rate and regular rhythm.  Exam reveals no gallop and no friction rub.   No murmur heard. Pulmonary/Chest: Effort normal and breath sounds normal. No respiratory distress. She has no wheezes.  Abdominal: Soft. Bowel sounds are normal. She exhibits no distension. There is no tenderness.  Musculoskeletal: Normal range of motion.  Neurological: She is alert and oriented to person, place, and time.  Skin: Skin is warm and dry. She is not diaphoretic.  Nursing note and vitals reviewed.    ED Treatments / Results  Labs (all labs ordered are listed, but only  abnormal results are displayed) Labs Reviewed  COMPREHENSIVE METABOLIC PANEL  CBC WITH DIFFERENTIAL/PLATELET  URINALYSIS, ROUTINE W REFLEX MICROSCOPIC  HCG, SERUM, QUALITATIVE    EKG  EKG Interpretation None       Radiology No results found.  Procedures Procedures (including critical care time)  Medications Ordered in ED Medications  sodium chloride 0.9 % bolus 1,000 mL (not administered)  ondansetron (ZOFRAN) injection 4 mg (not administered)     Initial Impression / Assessment and Plan / ED Course  I have reviewed the triage vital signs and the nursing notes.  Pertinent labs & imaging results that were available during my care of the patient were reviewed by me and  considered in my medical decision making (see chart for details).  Patient presents here with complaints of diarrhea and near syncope. She has a history of anxiety and was recently started on Prozac. She suspects today symptoms are side effect of this medication. Her laboratory studies are reassuring and her vital signs are stable. Her physical examination is nonfocal, however she does appear very anxious. She was given Ativan with good results.  I'm unable to say whether her symptoms are related to a side effect of this medication or possibly a viral gastroenteritis, however nothing appears emergent. The patient has decided she will stop taking her Prozac and see if her symptoms improve. I have agreed to prescribe a small quantity of Ativan which she can take as needed if her anxiety returns. She has follow-up with psychiatry in the near future.  Final Clinical Impressions(s) / ED Diagnoses   Final diagnoses:  None    New Prescriptions New Prescriptions   No medications on file     Geoffery Lyons, MD 07/08/16 1010

## 2016-07-08 NOTE — ED Triage Notes (Signed)
Pt states she has anxiety and was started on Prozac three days ago.  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  Pt states once she started on prozac the diarrhea worsened and started with nausea.  Pt unable to eat or drink and feels dehydrated.  Mother at bedside.

## 2016-07-08 NOTE — ED Notes (Signed)
Pt called out stating that she believes she is going to faint. Pts vitals are stable. EDP notified. Order to be placed.

## 2018-01-08 IMAGING — MR MR HEAD W/O CM
11 series · 48 of 48 positions shown · non-contrast
Comparison: None.

CLINICAL DATA: Visual disturbance.  Headaches.

EXAM:
MRI HEAD WITHOUT CONTRAST
TECHNIQUE: Multiplanar, multiecho pulse sequences of the brain and surrounding
structures were obtained without intravenous contrast.

[Series 2: T1 · sagittal · 5.0mm · 0.45mm/px · 1 of 23 slices shown]
[im 1/23]
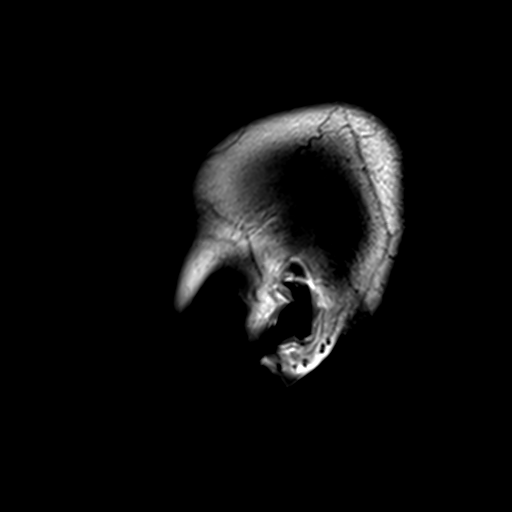

[Series 3: DWI · axial · 3.0mm · 1.80mm/px · z∈[-60,+83]mm · 9 of 100 slices shown (1 of 4)]
[im 1/100]
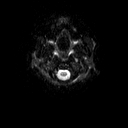
[im 13/100]
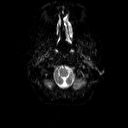
[im 25/100]
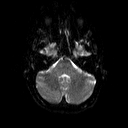
[im 38/100]
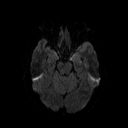
[im 50/100]
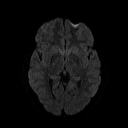
[im 62/100]
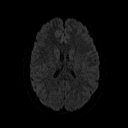
[im 75/100]
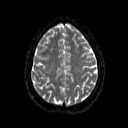
[im 87/100]
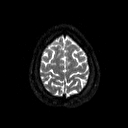
[im 100/100]
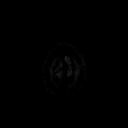

[Series 4: DWI · axial · 3.0mm · 1.80mm/px · z∈[-60,+83]mm · 4 of 49 slices shown (2 of 4)]
[im 1/49]
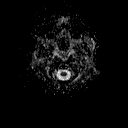
[im 17/49]
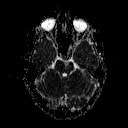
[im 33/49]
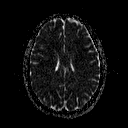
[im 49/49]
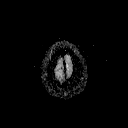

[Series 5: DWI · coronal · 5.0mm · 1.80mm/px · 6 of 72 slices shown (3 of 4)]
[im 1/72]
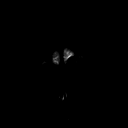
[im 15/72]
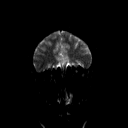
[im 29/72]
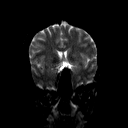
[im 43/72]
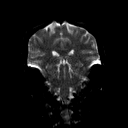
[im 57/72]
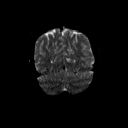
[im 72/72]
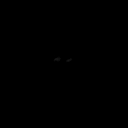

[Series 6: DWI · coronal · 5.0mm · 1.80mm/px · 3 of 35 slices shown (4 of 4)]
[im 1/35]
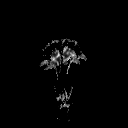
[im 18/35]
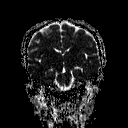
[im 35/35]
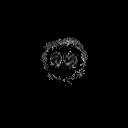

[Series 7: T2 · axial · 5.0mm · 0.51mm/px · z∈[-52,+86]mm · 2 of 22 slices shown (1 of 2)]
[im 1/22]
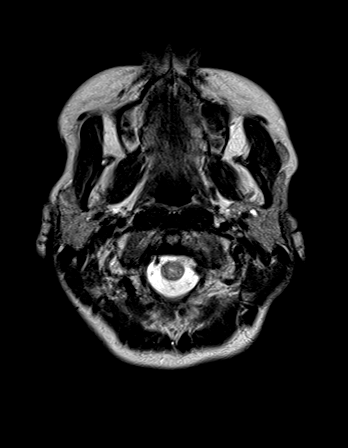
[im 22/22]
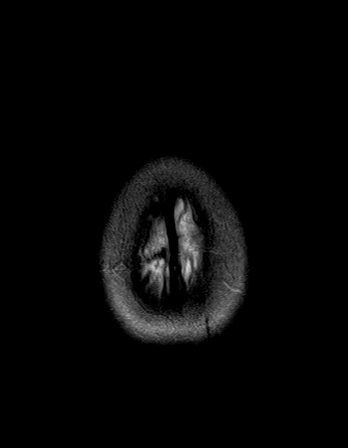

[Series 8: FLAIR · axial · 5.0mm · 0.45mm/px · z∈[-55,+83]mm · 2 of 22 slices shown]
[im 1/22]
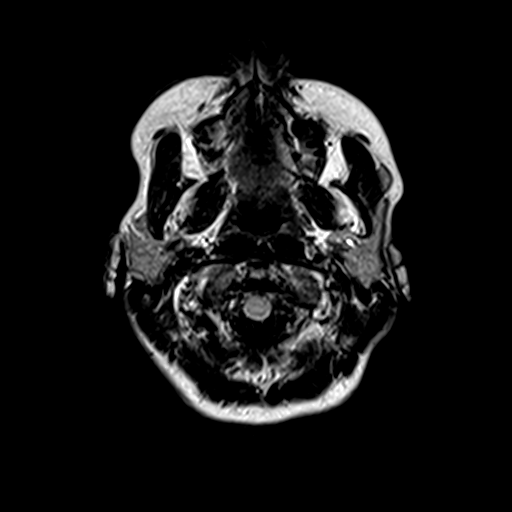
[im 22/22]
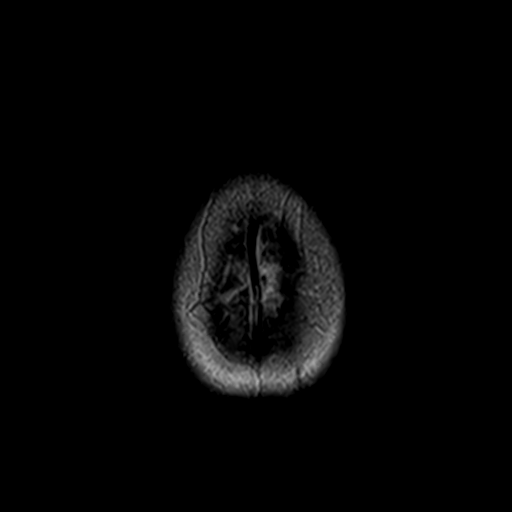

[Series 9: t1_mpr_tra · axial · 2.0mm · 0.45mm/px · z∈[-52,+87]mm · 6 of 72 slices shown]
[im 1/72]
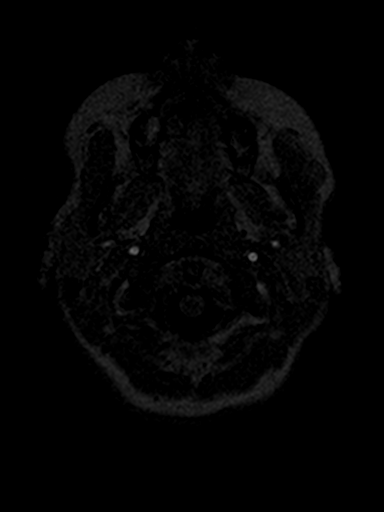
[im 15/72]
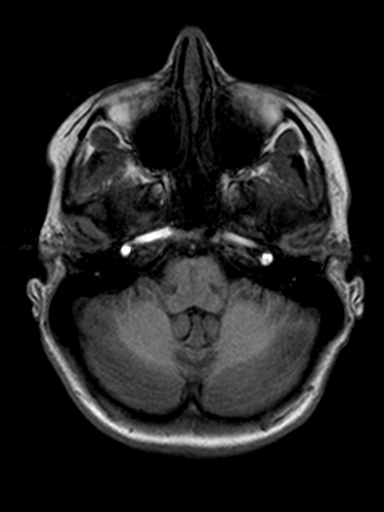
[im 29/72]
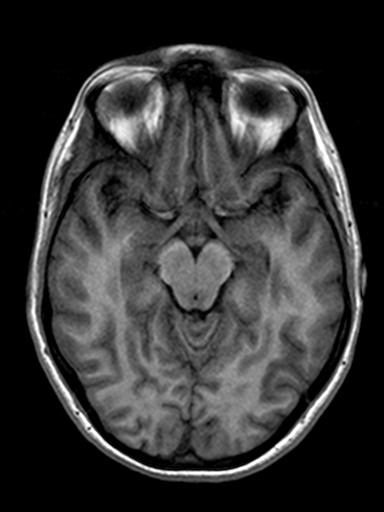
[im 43/72]
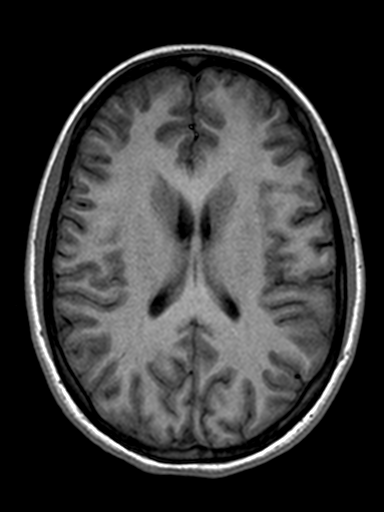
[im 57/72]
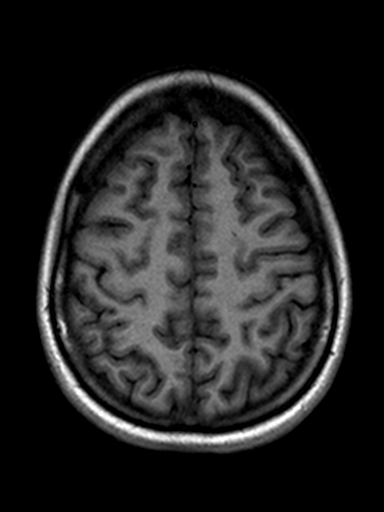
[im 72/72]
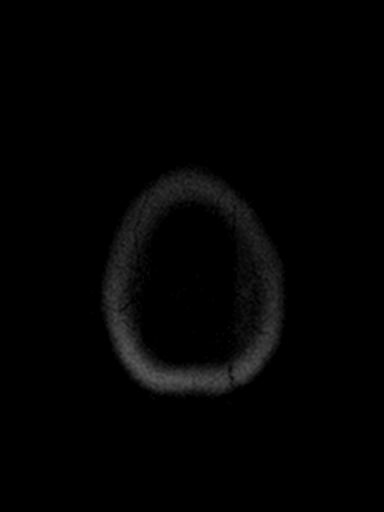

[Series 10: mip_images(sw) · axial · 16.0mm · 0.90mm/px · z∈[-47,+77]mm · 6 of 65 slices shown]
[im 1/65]
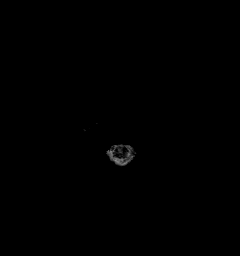
[im 13/65]
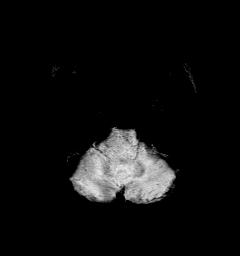
[im 26/65]
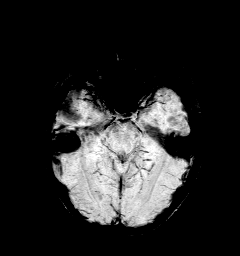
[im 39/65]
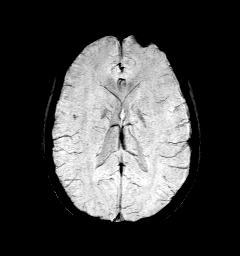
[im 52/65]
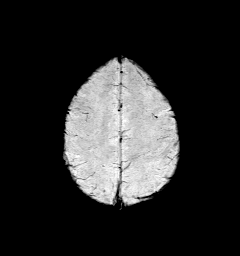
[im 65/65]
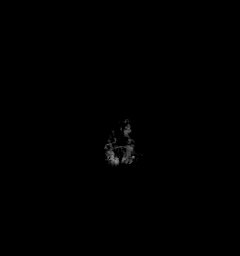

[Series 11: swi_images · axial · 2.0mm · 0.90mm/px · z∈[-54,+84]mm · 6 of 72 slices shown]
[im 1/72]
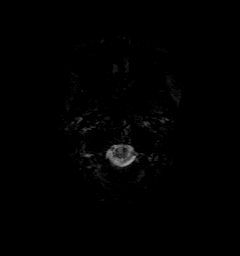
[im 15/72]
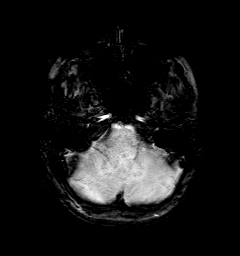
[im 29/72]
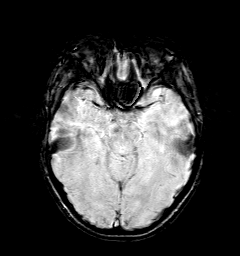
[im 43/72]
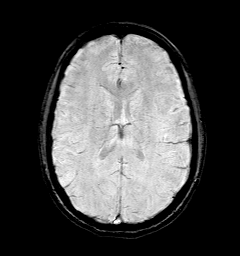
[im 57/72]
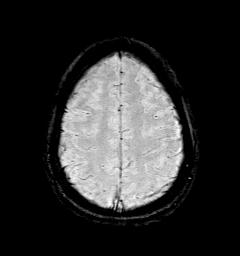
[im 72/72]
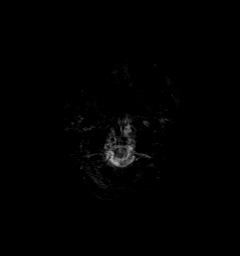

[Series 12: T2 · coronal · 5.0mm · 0.45mm/px · 3 of 30 slices shown (2 of 2)]
[im 1/30]
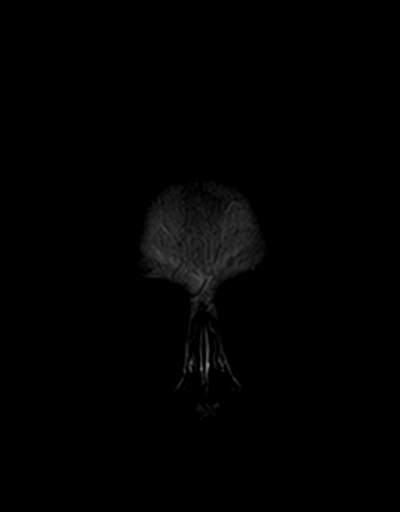
[im 15/30]
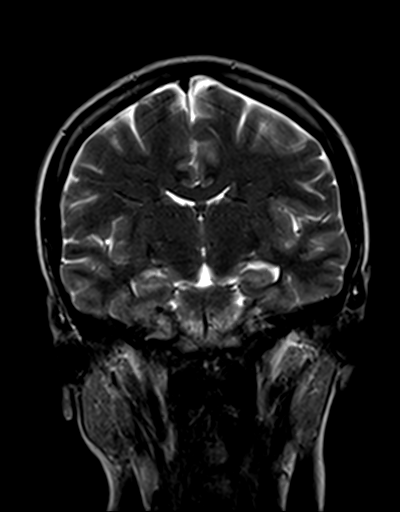
[im 30/30]
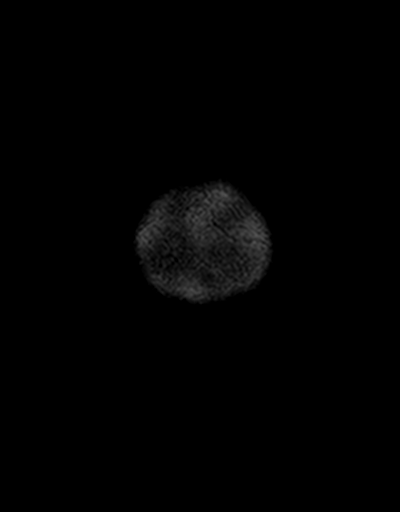

[48 of 48 positions shown; findings below may reference images not displayed]

FINDINGS: Ventricle size normal.  Cerebral volume normal.

Negative for acute or chronic infarction

Negative for demyelinating disease. Cerebral white matter normal.
Basal ganglia and brainstem normal. Cerebellum normal.

Negative for intracranial hemorrhage.

Negative for mass or edema.  No shift of the midline structures.

Pituitary normal in size.  Normal orbital structures.

Mild mucosal edema paranasal sinuses. Mastoid sinus clear
bilaterally

Circle of Willis patent.
IMPRESSION: Normal MRI of the brain without contrast.

Mild mucosal edema left posterior ethmoid sinus

## 2021-01-08 DIAGNOSIS — Z3482 Encounter for supervision of other normal pregnancy, second trimester: Secondary | ICD-10-CM | POA: Diagnosis not present

## 2021-02-05 DIAGNOSIS — Z3491 Encounter for supervision of normal pregnancy, unspecified, first trimester: Secondary | ICD-10-CM | POA: Diagnosis not present

## 2021-02-05 DIAGNOSIS — R87810 Cervical high risk human papillomavirus (HPV) DNA test positive: Secondary | ICD-10-CM | POA: Diagnosis not present

## 2021-02-07 DIAGNOSIS — Z419 Encounter for procedure for purposes other than remedying health state, unspecified: Secondary | ICD-10-CM | POA: Diagnosis not present

## 2021-03-10 DIAGNOSIS — Z419 Encounter for procedure for purposes other than remedying health state, unspecified: Secondary | ICD-10-CM | POA: Diagnosis not present

## 2021-03-24 DIAGNOSIS — Z3490 Encounter for supervision of normal pregnancy, unspecified, unspecified trimester: Secondary | ICD-10-CM | POA: Diagnosis not present

## 2021-03-24 DIAGNOSIS — R42 Dizziness and giddiness: Secondary | ICD-10-CM | POA: Diagnosis not present

## 2021-03-24 DIAGNOSIS — Z3A18 18 weeks gestation of pregnancy: Secondary | ICD-10-CM | POA: Diagnosis not present

## 2021-04-09 DIAGNOSIS — Z419 Encounter for procedure for purposes other than remedying health state, unspecified: Secondary | ICD-10-CM | POA: Diagnosis not present

## 2021-05-10 DIAGNOSIS — Z419 Encounter for procedure for purposes other than remedying health state, unspecified: Secondary | ICD-10-CM | POA: Diagnosis not present

## 2021-05-14 DIAGNOSIS — Z3A26 26 weeks gestation of pregnancy: Secondary | ICD-10-CM | POA: Diagnosis not present

## 2021-05-14 DIAGNOSIS — O30042 Twin pregnancy, dichorionic/diamniotic, second trimester: Secondary | ICD-10-CM | POA: Diagnosis not present

## 2021-05-18 DIAGNOSIS — Z3A26 26 weeks gestation of pregnancy: Secondary | ICD-10-CM | POA: Diagnosis not present

## 2021-05-18 DIAGNOSIS — R102 Pelvic and perineal pain: Secondary | ICD-10-CM | POA: Diagnosis not present

## 2021-05-18 DIAGNOSIS — Z3490 Encounter for supervision of normal pregnancy, unspecified, unspecified trimester: Secondary | ICD-10-CM | POA: Diagnosis not present

## 2021-06-10 DIAGNOSIS — Z419 Encounter for procedure for purposes other than remedying health state, unspecified: Secondary | ICD-10-CM | POA: Diagnosis not present

## 2021-06-17 DIAGNOSIS — O30043 Twin pregnancy, dichorionic/diamniotic, third trimester: Secondary | ICD-10-CM | POA: Diagnosis not present

## 2021-07-02 DIAGNOSIS — F331 Major depressive disorder, recurrent, moderate: Secondary | ICD-10-CM | POA: Diagnosis not present

## 2021-07-02 DIAGNOSIS — F411 Generalized anxiety disorder: Secondary | ICD-10-CM | POA: Diagnosis not present

## 2021-07-08 DIAGNOSIS — Z419 Encounter for procedure for purposes other than remedying health state, unspecified: Secondary | ICD-10-CM | POA: Diagnosis not present

## 2021-07-16 DIAGNOSIS — Z113 Encounter for screening for infections with a predominantly sexual mode of transmission: Secondary | ICD-10-CM | POA: Diagnosis not present

## 2021-07-16 DIAGNOSIS — O30043 Twin pregnancy, dichorionic/diamniotic, third trimester: Secondary | ICD-10-CM | POA: Diagnosis not present

## 2021-07-24 DIAGNOSIS — O30043 Twin pregnancy, dichorionic/diamniotic, third trimester: Secondary | ICD-10-CM | POA: Diagnosis not present

## 2021-07-24 DIAGNOSIS — O368130 Decreased fetal movements, third trimester, not applicable or unspecified: Secondary | ICD-10-CM | POA: Diagnosis not present

## 2021-07-24 DIAGNOSIS — R1011 Right upper quadrant pain: Secondary | ICD-10-CM | POA: Diagnosis not present

## 2021-07-24 DIAGNOSIS — R42 Dizziness and giddiness: Secondary | ICD-10-CM | POA: Diagnosis not present

## 2021-07-28 DIAGNOSIS — R519 Headache, unspecified: Secondary | ICD-10-CM | POA: Diagnosis not present

## 2021-07-28 DIAGNOSIS — O368132 Decreased fetal movements, third trimester, fetus 2: Secondary | ICD-10-CM | POA: Diagnosis not present

## 2021-07-28 DIAGNOSIS — Z3A36 36 weeks gestation of pregnancy: Secondary | ICD-10-CM | POA: Diagnosis not present

## 2021-07-28 DIAGNOSIS — O30043 Twin pregnancy, dichorionic/diamniotic, third trimester: Secondary | ICD-10-CM | POA: Diagnosis not present

## 2021-07-28 DIAGNOSIS — Z880 Allergy status to penicillin: Secondary | ICD-10-CM | POA: Diagnosis not present

## 2021-07-28 DIAGNOSIS — R1011 Right upper quadrant pain: Secondary | ICD-10-CM | POA: Diagnosis not present

## 2021-07-28 DIAGNOSIS — R1084 Generalized abdominal pain: Secondary | ICD-10-CM | POA: Diagnosis not present

## 2021-07-28 DIAGNOSIS — Z79899 Other long term (current) drug therapy: Secondary | ICD-10-CM | POA: Diagnosis not present

## 2021-07-28 DIAGNOSIS — O26893 Other specified pregnancy related conditions, third trimester: Secondary | ICD-10-CM | POA: Diagnosis not present

## 2021-07-31 DIAGNOSIS — Z881 Allergy status to other antibiotic agents status: Secondary | ICD-10-CM | POA: Diagnosis not present

## 2021-07-31 DIAGNOSIS — F329 Major depressive disorder, single episode, unspecified: Secondary | ICD-10-CM | POA: Diagnosis not present

## 2021-07-31 DIAGNOSIS — D509 Iron deficiency anemia, unspecified: Secondary | ICD-10-CM | POA: Diagnosis not present

## 2021-07-31 DIAGNOSIS — Z3483 Encounter for supervision of other normal pregnancy, third trimester: Secondary | ICD-10-CM | POA: Diagnosis not present

## 2021-07-31 DIAGNOSIS — Z9109 Other allergy status, other than to drugs and biological substances: Secondary | ICD-10-CM | POA: Diagnosis not present

## 2021-07-31 DIAGNOSIS — O30043 Twin pregnancy, dichorionic/diamniotic, third trimester: Secondary | ICD-10-CM | POA: Diagnosis not present

## 2021-07-31 DIAGNOSIS — O9902 Anemia complicating childbirth: Secondary | ICD-10-CM | POA: Diagnosis not present

## 2021-07-31 DIAGNOSIS — O99344 Other mental disorders complicating childbirth: Secondary | ICD-10-CM | POA: Diagnosis not present

## 2021-07-31 DIAGNOSIS — Z3A37 37 weeks gestation of pregnancy: Secondary | ICD-10-CM | POA: Diagnosis not present

## 2021-07-31 DIAGNOSIS — F411 Generalized anxiety disorder: Secondary | ICD-10-CM | POA: Diagnosis not present

## 2021-07-31 DIAGNOSIS — O26893 Other specified pregnancy related conditions, third trimester: Secondary | ICD-10-CM | POA: Diagnosis not present

## 2021-07-31 DIAGNOSIS — R102 Pelvic and perineal pain: Secondary | ICD-10-CM | POA: Diagnosis not present

## 2021-07-31 DIAGNOSIS — Z880 Allergy status to penicillin: Secondary | ICD-10-CM | POA: Diagnosis not present

## 2021-07-31 DIAGNOSIS — O4202 Full-term premature rupture of membranes, onset of labor within 24 hours of rupture: Secondary | ICD-10-CM | POA: Diagnosis not present

## 2021-07-31 DIAGNOSIS — Z79899 Other long term (current) drug therapy: Secondary | ICD-10-CM | POA: Diagnosis not present

## 2021-08-08 DIAGNOSIS — Z419 Encounter for procedure for purposes other than remedying health state, unspecified: Secondary | ICD-10-CM | POA: Diagnosis not present

## 2021-08-10 DIAGNOSIS — F331 Major depressive disorder, recurrent, moderate: Secondary | ICD-10-CM | POA: Diagnosis not present

## 2021-08-10 DIAGNOSIS — F411 Generalized anxiety disorder: Secondary | ICD-10-CM | POA: Diagnosis not present

## 2021-09-07 DIAGNOSIS — Z419 Encounter for procedure for purposes other than remedying health state, unspecified: Secondary | ICD-10-CM | POA: Diagnosis not present

## 2021-09-08 DIAGNOSIS — F331 Major depressive disorder, recurrent, moderate: Secondary | ICD-10-CM | POA: Diagnosis not present

## 2021-09-08 DIAGNOSIS — F411 Generalized anxiety disorder: Secondary | ICD-10-CM | POA: Diagnosis not present

## 2021-09-11 DIAGNOSIS — Z124 Encounter for screening for malignant neoplasm of cervix: Secondary | ICD-10-CM | POA: Diagnosis not present

## 2021-09-11 DIAGNOSIS — R87810 Cervical high risk human papillomavirus (HPV) DNA test positive: Secondary | ICD-10-CM | POA: Diagnosis not present

## 2021-09-21 DIAGNOSIS — N870 Mild cervical dysplasia: Secondary | ICD-10-CM | POA: Diagnosis not present

## 2021-09-21 DIAGNOSIS — R87810 Cervical high risk human papillomavirus (HPV) DNA test positive: Secondary | ICD-10-CM | POA: Diagnosis not present

## 2021-10-08 DIAGNOSIS — Z419 Encounter for procedure for purposes other than remedying health state, unspecified: Secondary | ICD-10-CM | POA: Diagnosis not present

## 2021-11-07 DIAGNOSIS — Z419 Encounter for procedure for purposes other than remedying health state, unspecified: Secondary | ICD-10-CM | POA: Diagnosis not present

## 2021-12-08 DIAGNOSIS — Z419 Encounter for procedure for purposes other than remedying health state, unspecified: Secondary | ICD-10-CM | POA: Diagnosis not present

## 2021-12-08 DIAGNOSIS — K2900 Acute gastritis without bleeding: Secondary | ICD-10-CM | POA: Diagnosis not present

## 2021-12-08 DIAGNOSIS — R1010 Upper abdominal pain, unspecified: Secondary | ICD-10-CM | POA: Diagnosis not present

## 2021-12-21 DIAGNOSIS — K219 Gastro-esophageal reflux disease without esophagitis: Secondary | ICD-10-CM | POA: Diagnosis not present

## 2021-12-21 DIAGNOSIS — R1013 Epigastric pain: Secondary | ICD-10-CM | POA: Diagnosis not present

## 2021-12-21 DIAGNOSIS — R1084 Generalized abdominal pain: Secondary | ICD-10-CM | POA: Diagnosis not present

## 2021-12-21 DIAGNOSIS — R109 Unspecified abdominal pain: Secondary | ICD-10-CM | POA: Diagnosis not present

## 2022-01-08 DIAGNOSIS — Z419 Encounter for procedure for purposes other than remedying health state, unspecified: Secondary | ICD-10-CM | POA: Diagnosis not present

## 2022-01-11 DIAGNOSIS — R1013 Epigastric pain: Secondary | ICD-10-CM | POA: Diagnosis not present

## 2022-01-11 DIAGNOSIS — R109 Unspecified abdominal pain: Secondary | ICD-10-CM | POA: Diagnosis not present

## 2022-01-12 DIAGNOSIS — K219 Gastro-esophageal reflux disease without esophagitis: Secondary | ICD-10-CM | POA: Diagnosis not present

## 2022-01-12 DIAGNOSIS — R1013 Epigastric pain: Secondary | ICD-10-CM | POA: Diagnosis not present

## 2022-01-12 DIAGNOSIS — F411 Generalized anxiety disorder: Secondary | ICD-10-CM | POA: Diagnosis not present

## 2022-01-12 DIAGNOSIS — R7989 Other specified abnormal findings of blood chemistry: Secondary | ICD-10-CM | POA: Diagnosis not present

## 2022-02-07 DIAGNOSIS — Z419 Encounter for procedure for purposes other than remedying health state, unspecified: Secondary | ICD-10-CM | POA: Diagnosis not present

## 2022-03-10 DIAGNOSIS — Z419 Encounter for procedure for purposes other than remedying health state, unspecified: Secondary | ICD-10-CM | POA: Diagnosis not present

## 2022-04-09 DIAGNOSIS — Z419 Encounter for procedure for purposes other than remedying health state, unspecified: Secondary | ICD-10-CM | POA: Diagnosis not present

## 2022-05-10 DIAGNOSIS — Z419 Encounter for procedure for purposes other than remedying health state, unspecified: Secondary | ICD-10-CM | POA: Diagnosis not present

## 2022-06-10 DIAGNOSIS — Z419 Encounter for procedure for purposes other than remedying health state, unspecified: Secondary | ICD-10-CM | POA: Diagnosis not present

## 2022-07-09 DIAGNOSIS — Z419 Encounter for procedure for purposes other than remedying health state, unspecified: Secondary | ICD-10-CM | POA: Diagnosis not present

## 2022-08-09 DIAGNOSIS — Z419 Encounter for procedure for purposes other than remedying health state, unspecified: Secondary | ICD-10-CM | POA: Diagnosis not present

## 2022-08-26 DIAGNOSIS — F411 Generalized anxiety disorder: Secondary | ICD-10-CM | POA: Diagnosis not present

## 2022-08-26 DIAGNOSIS — F902 Attention-deficit hyperactivity disorder, combined type: Secondary | ICD-10-CM | POA: Diagnosis not present

## 2022-09-08 DIAGNOSIS — Z419 Encounter for procedure for purposes other than remedying health state, unspecified: Secondary | ICD-10-CM | POA: Diagnosis not present

## 2022-09-09 DIAGNOSIS — F411 Generalized anxiety disorder: Secondary | ICD-10-CM | POA: Diagnosis not present

## 2022-09-09 DIAGNOSIS — F902 Attention-deficit hyperactivity disorder, combined type: Secondary | ICD-10-CM | POA: Diagnosis not present

## 2022-09-13 DIAGNOSIS — N870 Mild cervical dysplasia: Secondary | ICD-10-CM | POA: Diagnosis not present

## 2022-09-13 DIAGNOSIS — R87810 Cervical high risk human papillomavirus (HPV) DNA test positive: Secondary | ICD-10-CM | POA: Diagnosis not present

## 2022-09-13 DIAGNOSIS — Z01419 Encounter for gynecological examination (general) (routine) without abnormal findings: Secondary | ICD-10-CM | POA: Diagnosis not present

## 2022-10-09 DIAGNOSIS — Z419 Encounter for procedure for purposes other than remedying health state, unspecified: Secondary | ICD-10-CM | POA: Diagnosis not present

## 2022-10-14 DIAGNOSIS — F411 Generalized anxiety disorder: Secondary | ICD-10-CM | POA: Diagnosis not present

## 2022-10-14 DIAGNOSIS — F902 Attention-deficit hyperactivity disorder, combined type: Secondary | ICD-10-CM | POA: Diagnosis not present

## 2022-10-15 DIAGNOSIS — R87810 Cervical high risk human papillomavirus (HPV) DNA test positive: Secondary | ICD-10-CM | POA: Diagnosis not present

## 2022-10-15 DIAGNOSIS — R87618 Other abnormal cytological findings on specimens from cervix uteri: Secondary | ICD-10-CM | POA: Diagnosis not present

## 2022-12-03 DIAGNOSIS — F902 Attention-deficit hyperactivity disorder, combined type: Secondary | ICD-10-CM | POA: Diagnosis not present

## 2022-12-03 DIAGNOSIS — F411 Generalized anxiety disorder: Secondary | ICD-10-CM | POA: Diagnosis not present

## 2022-12-09 DIAGNOSIS — Z419 Encounter for procedure for purposes other than remedying health state, unspecified: Secondary | ICD-10-CM | POA: Diagnosis not present

## 2023-01-09 DIAGNOSIS — Z419 Encounter for procedure for purposes other than remedying health state, unspecified: Secondary | ICD-10-CM | POA: Diagnosis not present

## 2023-01-11 DIAGNOSIS — F411 Generalized anxiety disorder: Secondary | ICD-10-CM | POA: Diagnosis not present

## 2023-01-11 DIAGNOSIS — F902 Attention-deficit hyperactivity disorder, combined type: Secondary | ICD-10-CM | POA: Diagnosis not present

## 2023-02-08 DIAGNOSIS — Z419 Encounter for procedure for purposes other than remedying health state, unspecified: Secondary | ICD-10-CM | POA: Diagnosis not present

## 2023-03-11 DIAGNOSIS — Z419 Encounter for procedure for purposes other than remedying health state, unspecified: Secondary | ICD-10-CM | POA: Diagnosis not present

## 2023-03-15 DIAGNOSIS — F902 Attention-deficit hyperactivity disorder, combined type: Secondary | ICD-10-CM | POA: Diagnosis not present

## 2023-03-15 DIAGNOSIS — F411 Generalized anxiety disorder: Secondary | ICD-10-CM | POA: Diagnosis not present

## 2023-04-10 DIAGNOSIS — Z419 Encounter for procedure for purposes other than remedying health state, unspecified: Secondary | ICD-10-CM | POA: Diagnosis not present

## 2023-05-11 DIAGNOSIS — Z419 Encounter for procedure for purposes other than remedying health state, unspecified: Secondary | ICD-10-CM | POA: Diagnosis not present

## 2023-06-11 DIAGNOSIS — Z419 Encounter for procedure for purposes other than remedying health state, unspecified: Secondary | ICD-10-CM | POA: Diagnosis not present

## 2023-07-09 DIAGNOSIS — Z419 Encounter for procedure for purposes other than remedying health state, unspecified: Secondary | ICD-10-CM | POA: Diagnosis not present

## 2023-08-20 DIAGNOSIS — Z419 Encounter for procedure for purposes other than remedying health state, unspecified: Secondary | ICD-10-CM | POA: Diagnosis not present

## 2023-09-13 DIAGNOSIS — F411 Generalized anxiety disorder: Secondary | ICD-10-CM | POA: Diagnosis not present

## 2023-09-13 DIAGNOSIS — F902 Attention-deficit hyperactivity disorder, combined type: Secondary | ICD-10-CM | POA: Diagnosis not present

## 2023-09-15 DIAGNOSIS — R87810 Cervical high risk human papillomavirus (HPV) DNA test positive: Secondary | ICD-10-CM | POA: Diagnosis not present

## 2023-09-15 DIAGNOSIS — Z01411 Encounter for gynecological examination (general) (routine) with abnormal findings: Secondary | ICD-10-CM | POA: Diagnosis not present

## 2023-09-15 DIAGNOSIS — Z1151 Encounter for screening for human papillomavirus (HPV): Secondary | ICD-10-CM | POA: Diagnosis not present

## 2023-09-15 DIAGNOSIS — N920 Excessive and frequent menstruation with regular cycle: Secondary | ICD-10-CM | POA: Diagnosis not present

## 2023-09-15 DIAGNOSIS — Z124 Encounter for screening for malignant neoplasm of cervix: Secondary | ICD-10-CM | POA: Diagnosis not present

## 2023-09-15 DIAGNOSIS — Z01419 Encounter for gynecological examination (general) (routine) without abnormal findings: Secondary | ICD-10-CM | POA: Diagnosis not present

## 2023-09-19 DIAGNOSIS — Z419 Encounter for procedure for purposes other than remedying health state, unspecified: Secondary | ICD-10-CM | POA: Diagnosis not present

## 2023-10-20 DIAGNOSIS — Z419 Encounter for procedure for purposes other than remedying health state, unspecified: Secondary | ICD-10-CM | POA: Diagnosis not present

## 2023-10-27 DIAGNOSIS — R87618 Other abnormal cytological findings on specimens from cervix uteri: Secondary | ICD-10-CM | POA: Diagnosis not present

## 2023-10-27 DIAGNOSIS — R87810 Cervical high risk human papillomavirus (HPV) DNA test positive: Secondary | ICD-10-CM | POA: Diagnosis not present

## 2023-11-19 DIAGNOSIS — Z419 Encounter for procedure for purposes other than remedying health state, unspecified: Secondary | ICD-10-CM | POA: Diagnosis not present

## 2023-12-20 DIAGNOSIS — Z419 Encounter for procedure for purposes other than remedying health state, unspecified: Secondary | ICD-10-CM | POA: Diagnosis not present

## 2024-01-20 DIAGNOSIS — Z419 Encounter for procedure for purposes other than remedying health state, unspecified: Secondary | ICD-10-CM | POA: Diagnosis not present

## 2024-03-13 DIAGNOSIS — F902 Attention-deficit hyperactivity disorder, combined type: Secondary | ICD-10-CM | POA: Diagnosis not present

## 2024-03-13 DIAGNOSIS — F411 Generalized anxiety disorder: Secondary | ICD-10-CM | POA: Diagnosis not present

## 2024-04-10 DIAGNOSIS — F902 Attention-deficit hyperactivity disorder, combined type: Secondary | ICD-10-CM | POA: Diagnosis not present

## 2024-04-10 DIAGNOSIS — F411 Generalized anxiety disorder: Secondary | ICD-10-CM | POA: Diagnosis not present

## 2024-05-08 DIAGNOSIS — F411 Generalized anxiety disorder: Secondary | ICD-10-CM | POA: Diagnosis not present
